# Patient Record
Sex: Female | Born: 1975
Health system: Southern US, Community
[De-identification: ages and names within clinical notes are randomized; demographics above are authoritative.]

## PROBLEM LIST (undated history)

## (undated) HISTORY — PX: OTHER SURGICAL HISTORY: SHX169

---

## 1997-09-19 ENCOUNTER — Other Ambulatory Visit: Admission: RE | Admit: 1997-09-19 | Discharge: 1997-09-19 | Payer: Self-pay | Admitting: Obstetrics

## 2007-11-06 ENCOUNTER — Other Ambulatory Visit: Admission: RE | Admit: 2007-11-06 | Discharge: 2007-11-06 | Payer: Self-pay | Admitting: Obstetrics and Gynecology

## 2008-12-18 ENCOUNTER — Other Ambulatory Visit: Admission: RE | Admit: 2008-12-18 | Discharge: 2008-12-18 | Payer: Self-pay | Admitting: Family Medicine

## 2010-03-22 ENCOUNTER — Other Ambulatory Visit
Admission: RE | Admit: 2010-03-22 | Discharge: 2010-03-22 | Payer: Self-pay | Source: Home / Self Care | Admitting: Family Medicine

## 2012-04-17 ENCOUNTER — Other Ambulatory Visit (HOSPITAL_COMMUNITY)
Admission: RE | Admit: 2012-04-17 | Discharge: 2012-04-17 | Disposition: A | Discharge status: Home / Self Care | Payer: BC Managed Care – PPO | Source: Ambulatory Visit | Attending: Family Medicine | Admitting: Family Medicine

## 2012-04-17 DIAGNOSIS — Z0000 Encounter for general adult medical examination without abnormal findings: Secondary | ICD-10-CM | POA: Insufficient documentation

## 2016-03-28 ENCOUNTER — Emergency Department (HOSPITAL_COMMUNITY): Payer: Self-pay

## 2016-03-28 ENCOUNTER — Emergency Department (HOSPITAL_COMMUNITY)
Admission: EM | Admit: 2016-03-28 | Discharge: 2016-03-29 | Disposition: A | Discharge status: Home / Self Care | Payer: Self-pay | Attending: Emergency Medicine | Admitting: Emergency Medicine

## 2016-03-28 ENCOUNTER — Encounter (HOSPITAL_COMMUNITY): Payer: Self-pay | Admitting: Emergency Medicine

## 2016-03-28 DIAGNOSIS — J40 Bronchitis, not specified as acute or chronic: Secondary | ICD-10-CM | POA: Insufficient documentation

## 2016-03-28 MED ORDER — PREDNISONE 20 MG PO TABS
60.0000 mg | ORAL_TABLET | Freq: Once | ORAL | Status: AC
  Administered 2016-03-28: 60 mg via ORAL
  Filled 2016-03-28: qty 3

## 2016-03-28 MED ORDER — HYDROCOD POLST-CPM POLST ER 10-8 MG/5ML PO SUER
5.0000 mL | Freq: Once | ORAL | Status: AC
  Administered 2016-03-28: 5 mL via ORAL
  Filled 2016-03-28: qty 5

## 2016-03-28 MED ORDER — IPRATROPIUM-ALBUTEROL 0.5-2.5 (3) MG/3ML IN SOLN
3.0000 mL | Freq: Once | RESPIRATORY_TRACT | Status: AC
  Administered 2016-03-28: 3 mL via RESPIRATORY_TRACT
  Filled 2016-03-28: qty 3

## 2016-03-28 MED ORDER — ALBUTEROL SULFATE HFA 108 (90 BASE) MCG/ACT IN AERS
2.0000 | INHALATION_SPRAY | Freq: Once | RESPIRATORY_TRACT | Status: AC
  Administered 2016-03-29: 2 via RESPIRATORY_TRACT
  Filled 2016-03-28: qty 6.7

## 2016-03-28 MED ORDER — PREDNISONE 20 MG PO TABS: 40.0000 mg | ORAL_TABLET | Freq: Every day | ORAL | 0 refills | Status: DC

## 2016-03-28 MED ORDER — HYDROCODONE-HOMATROPINE 5-1.5 MG/5ML PO SYRP: 5.0000 mL | ORAL_SOLUTION | Freq: Four times a day (QID) | ORAL | 0 refills | Status: DC | PRN
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
## 2016-03-28 NOTE — ED Triage Notes (Signed)
Pt. reports persistent productive cough with runny nose, nasal congestion , fatigue and chest congestion onset last week , denies fever or chills . Respirations unlabored .

## 2016-03-29 NOTE — Discharge Instructions (Signed)
Take inhaler 2 puffs every 4 hrs for cough and chest tightness. Take hycodan for cough as prescribed as needed. Do not drive if taking this medication. Take prednisone as prescribed until all gone. Follow up with your doctor if not improving. Return if worsening.

## 2017-03-14 ENCOUNTER — Ambulatory Visit (INDEPENDENT_AMBULATORY_CARE_PROVIDER_SITE_OTHER): Payer: 59 | Admitting: Cardiology

## 2017-03-14 ENCOUNTER — Encounter: Payer: Self-pay | Admitting: Cardiology

## 2017-03-14 DIAGNOSIS — R0789 Other chest pain: Secondary | ICD-10-CM | POA: Diagnosis not present

## 2017-03-14 DIAGNOSIS — I951 Orthostatic hypotension: Secondary | ICD-10-CM

## 2017-03-14 DIAGNOSIS — I959 Hypotension, unspecified: Secondary | ICD-10-CM | POA: Insufficient documentation

## 2017-03-14 DIAGNOSIS — R079 Chest pain, unspecified: Secondary | ICD-10-CM | POA: Insufficient documentation

## 2017-03-14 NOTE — Addendum Note (Signed)
Addended by: Orland Penman on: 03/14/2017 10:10 AM   Modules accepted: Orders

## 2017-03-14 NOTE — Addendum Note (Signed)
Addended by: Mattie Marlin on: 03/14/2017 10:04 AM   Modules accepted: Orders

## 2017-03-14 NOTE — Patient Instructions (Addendum)
Medication Instructions:  Your physician recommends that you continue on your current medications as directed. Please refer to the Current Medication list given to you today.   Labwork: None  Testing/Procedures: Your physician has requested that you have a stress echocardiogram. For further information please visit [email or web address]. Please follow instruction sheet as given.  You had an EKG today  Follow-Up: Your physician recommends that you schedule a follow-up appointment in: 3 months  Any Other Special Instructions Will Be Listed Below (If Applicable).     If you need a refill on your cardiac medications before your next appointment, please call your pharmacy.   [place], RN, BSN   Exercise Stress Echocardiogram An exercise stress echocardiogram is a test that checks how well your heart is working. For this test, you will walk on a treadmill to make your heart beat faster. This test uses sound waves (ultrasound) and a computer to make pictures (images) of your heart. These pictures will be taken before you exercise and after you exercise. What happens before the procedure?  Follow instructions from your doctor about what you cannot eat or drink before the test.  Do not drink or eat anything that has caffeine in it. Stop having caffeine for 24 hours before the test.  Ask your doctor about changing or stopping your normal medicines. This is important if you take diabetes medicines or blood thinners. Ask your doctor if you should take your medicines with water before the test.  If you use an inhaler, bring it to the test.  Do not use any products that have nicotine or tobacco in them, such as cigarettes and e-cigarettes. Stop using them for 4 hours before the test. If you need help quitting, ask your doctor.  Wear comfortable shoes and clothing. What happens during the procedure?  You will be hooked up to a TV screen. Your doctor will watch the screen  to see how fast your heart beats during the test.  Before you exercise, a computer will make a picture of your heart. To do this: ? A gel will be put on your chest. ? A wand will be moved over the gel. ? Sound waves from the wand will go to the computer to make the picture.  Your will start walking on a treadmill. The treadmill will start at a slow speed. It will get faster a little bit at a time. When you walk faster, your heart will beat faster.  The treadmill will be stopped when your heart is working hard.  You will lie down right away so another picture of your heart can be taken.  The test will take 30-60 minutes. What happens after the procedure?  Your heart rate and blood pressure will be watched after the test.  If your doctor says that you can, you may: ? Eat what you usually eat. ? Do your normal activities. ? Take medicines like normal. Summary  An exercise stress echocardiogram is a test that checks how well your heart is working.  Follow instructions about what you cannot eat or drink before the test. Ask your doctor if you should take your normal medicines before the test.  Stop having caffeine for 24 hours before the test. Do not use anything with nicotine or tobacco in it for 4 hours before the test.  A computer will take a picture of your heart before you walk on a treadmill. It will take another picture when you are done walking.  Your heart rate and blood pressure will be watched after the test. This information is not intended to replace advice given to you by your health care provider. Make sure you discuss any questions you have with your health care provider. Document Released: 02/27/2009 Document Revised: 01/24/2016 Document Reviewed: 01/24/2016 Elsevier Interactive Patient Education  2017 [employer].
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
## 2017-03-15 ENCOUNTER — Other Ambulatory Visit (INDEPENDENT_AMBULATORY_CARE_PROVIDER_SITE_OTHER): Payer: 59

## 2017-03-15 DIAGNOSIS — R079 Chest pain, unspecified: Secondary | ICD-10-CM

## 2017-04-03 ENCOUNTER — Ambulatory Visit (HOSPITAL_BASED_OUTPATIENT_CLINIC_OR_DEPARTMENT_OTHER)
Admission: RE | Admit: 2017-04-03 | Discharge: 2017-04-03 | Disposition: A | Discharge status: Home / Self Care | Payer: 59 | Source: Ambulatory Visit | Attending: Cardiology | Admitting: Cardiology

## 2017-04-03 DIAGNOSIS — I951 Orthostatic hypotension: Secondary | ICD-10-CM

## 2017-04-03 DIAGNOSIS — R0789 Other chest pain: Secondary | ICD-10-CM | POA: Diagnosis not present

## 2017-04-03 LAB — ECHOCARDIOGRAM STRESS TEST
FS: 31 % (ref 28–44)
IVS/LV PW RATIO, ED: 1.12
LA ID, A-P, ES: 25 mm
LADIAMINDEX: 1.55 cm/m2
LEFT ATRIUM END SYS DIAM: 25 mm
LV PW d: 7.73 mm — AB (ref 0.6–1.1)

## 2017-04-03 NOTE — Progress Notes (Signed)
Echocardiogram Echocardiogram Stress Test has been performed.  Cynthia Fitzpatrick 04/03/2017, 11:35 AM

## 2017-06-12 ENCOUNTER — Other Ambulatory Visit (HOSPITAL_COMMUNITY)
Admission: RE | Admit: 2017-06-12 | Discharge: 2017-06-12 | Disposition: A | Discharge status: Home / Self Care | Payer: 59 | Source: Ambulatory Visit | Attending: Family Medicine | Admitting: Family Medicine

## 2017-06-12 ENCOUNTER — Other Ambulatory Visit: Payer: Self-pay | Admitting: Family Medicine

## 2017-06-12 DIAGNOSIS — Z124 Encounter for screening for malignant neoplasm of cervix: Secondary | ICD-10-CM | POA: Insufficient documentation

## 2017-06-13 LAB — CYTOLOGY - PAP
Diagnosis: NEGATIVE
HPV (WINDOPATH): NOT DETECTED

## 2018-08-09 ENCOUNTER — Other Ambulatory Visit: Payer: Self-pay | Admitting: Family Medicine

## 2018-08-09 DIAGNOSIS — Z1231 Encounter for screening mammogram for malignant neoplasm of breast: Secondary | ICD-10-CM

## 2018-10-04 ENCOUNTER — Other Ambulatory Visit: Payer: Self-pay

## 2018-10-04 ENCOUNTER — Ambulatory Visit
Admission: RE | Admit: 2018-10-04 | Discharge: 2018-10-04 | Disposition: A | Discharge status: Home / Self Care | Payer: 59 | Source: Ambulatory Visit | Attending: Family Medicine | Admitting: Family Medicine

## 2018-10-04 DIAGNOSIS — Z1231 Encounter for screening mammogram for malignant neoplasm of breast: Secondary | ICD-10-CM

## 2018-10-16 DIAGNOSIS — M65832 Other synovitis and tenosynovitis, left forearm: Secondary | ICD-10-CM | POA: Diagnosis not present

## 2018-12-25 DIAGNOSIS — H401113 Primary open-angle glaucoma, right eye, severe stage: Secondary | ICD-10-CM | POA: Diagnosis not present

## 2018-12-25 DIAGNOSIS — R51 Headache: Secondary | ICD-10-CM | POA: Diagnosis not present

## 2018-12-25 DIAGNOSIS — H401122 Primary open-angle glaucoma, left eye, moderate stage: Secondary | ICD-10-CM | POA: Diagnosis not present

## 2019-08-01 ENCOUNTER — Other Ambulatory Visit: Payer: Self-pay | Admitting: Family Medicine

## 2019-08-01 DIAGNOSIS — E559 Vitamin D deficiency, unspecified: Secondary | ICD-10-CM | POA: Diagnosis not present

## 2019-08-01 DIAGNOSIS — R69 Illness, unspecified: Secondary | ICD-10-CM | POA: Diagnosis not present

## 2019-08-01 DIAGNOSIS — Z13220 Encounter for screening for lipoid disorders: Secondary | ICD-10-CM | POA: Diagnosis not present

## 2019-08-01 DIAGNOSIS — Z1231 Encounter for screening mammogram for malignant neoplasm of breast: Secondary | ICD-10-CM | POA: Diagnosis not present

## 2019-08-01 DIAGNOSIS — Z0000 Encounter for general adult medical examination without abnormal findings: Secondary | ICD-10-CM | POA: Diagnosis not present

## 2019-10-07 ENCOUNTER — Other Ambulatory Visit: Payer: Self-pay

## 2019-10-07 ENCOUNTER — Ambulatory Visit
Admission: RE | Admit: 2019-10-07 | Discharge: 2019-10-07 | Disposition: A | Discharge status: Home / Self Care | Payer: 59 | Source: Ambulatory Visit | Attending: Family Medicine | Admitting: Family Medicine

## 2019-10-07 DIAGNOSIS — Z1231 Encounter for screening mammogram for malignant neoplasm of breast: Secondary | ICD-10-CM | POA: Diagnosis not present

## 2020-01-08 DIAGNOSIS — H401122 Primary open-angle glaucoma, left eye, moderate stage: Secondary | ICD-10-CM | POA: Diagnosis not present

## 2020-01-08 DIAGNOSIS — R519 Headache, unspecified: Secondary | ICD-10-CM | POA: Diagnosis not present

## 2020-01-08 DIAGNOSIS — H401113 Primary open-angle glaucoma, right eye, severe stage: Secondary | ICD-10-CM | POA: Diagnosis not present

## 2020-03-31 DIAGNOSIS — R519 Headache, unspecified: Secondary | ICD-10-CM | POA: Diagnosis not present

## 2020-03-31 DIAGNOSIS — H401122 Primary open-angle glaucoma, left eye, moderate stage: Secondary | ICD-10-CM | POA: Diagnosis not present

## 2020-03-31 DIAGNOSIS — H401113 Primary open-angle glaucoma, right eye, severe stage: Secondary | ICD-10-CM | POA: Diagnosis not present

## 2020-04-15 DIAGNOSIS — T50B95A Adverse effect of other viral vaccines, initial encounter: Secondary | ICD-10-CM | POA: Diagnosis not present

## 2020-04-15 DIAGNOSIS — Z309 Encounter for contraceptive management, unspecified: Secondary | ICD-10-CM | POA: Diagnosis not present

## 2020-04-15 DIAGNOSIS — E65 Localized adiposity: Secondary | ICD-10-CM | POA: Diagnosis not present

## 2020-05-12 DIAGNOSIS — H401122 Primary open-angle glaucoma, left eye, moderate stage: Secondary | ICD-10-CM | POA: Diagnosis not present

## 2020-05-12 DIAGNOSIS — R519 Headache, unspecified: Secondary | ICD-10-CM | POA: Diagnosis not present

## 2020-05-12 DIAGNOSIS — H401113 Primary open-angle glaucoma, right eye, severe stage: Secondary | ICD-10-CM | POA: Diagnosis not present

## 2020-08-02 DIAGNOSIS — Z20822 Contact with and (suspected) exposure to covid-19: Secondary | ICD-10-CM | POA: Diagnosis not present

## 2020-08-03 DIAGNOSIS — D229 Melanocytic nevi, unspecified: Secondary | ICD-10-CM | POA: Diagnosis not present

## 2020-08-03 DIAGNOSIS — Z1231 Encounter for screening mammogram for malignant neoplasm of breast: Secondary | ICD-10-CM | POA: Diagnosis not present

## 2020-08-03 DIAGNOSIS — Z13220 Encounter for screening for lipoid disorders: Secondary | ICD-10-CM | POA: Diagnosis not present

## 2020-08-03 DIAGNOSIS — Z113 Encounter for screening for infections with a predominantly sexual mode of transmission: Secondary | ICD-10-CM | POA: Diagnosis not present

## 2020-08-03 DIAGNOSIS — D72819 Decreased white blood cell count, unspecified: Secondary | ICD-10-CM | POA: Diagnosis not present

## 2020-08-03 DIAGNOSIS — Z0000 Encounter for general adult medical examination without abnormal findings: Secondary | ICD-10-CM | POA: Diagnosis not present

## 2020-08-03 DIAGNOSIS — R69 Illness, unspecified: Secondary | ICD-10-CM | POA: Diagnosis not present

## 2020-08-04 DIAGNOSIS — R69 Illness, unspecified: Secondary | ICD-10-CM | POA: Diagnosis not present

## 2020-08-04 DIAGNOSIS — Z0000 Encounter for general adult medical examination without abnormal findings: Secondary | ICD-10-CM | POA: Diagnosis not present

## 2020-08-11 ENCOUNTER — Other Ambulatory Visit: Payer: Self-pay | Admitting: Family Medicine

## 2020-08-11 DIAGNOSIS — Z1231 Encounter for screening mammogram for malignant neoplasm of breast: Secondary | ICD-10-CM

## 2020-08-26 DIAGNOSIS — H401122 Primary open-angle glaucoma, left eye, moderate stage: Secondary | ICD-10-CM | POA: Diagnosis not present

## 2020-08-26 DIAGNOSIS — H401113 Primary open-angle glaucoma, right eye, severe stage: Secondary | ICD-10-CM | POA: Diagnosis not present

## 2020-11-09 ENCOUNTER — Ambulatory Visit
Admission: RE | Admit: 2020-11-09 | Discharge: 2020-11-09 | Disposition: A | Discharge status: Home / Self Care | Payer: 59 | Source: Ambulatory Visit | Attending: Family Medicine | Admitting: Family Medicine

## 2020-11-09 ENCOUNTER — Other Ambulatory Visit: Payer: Self-pay

## 2020-11-09 DIAGNOSIS — Z1231 Encounter for screening mammogram for malignant neoplasm of breast: Secondary | ICD-10-CM

## 2020-11-24 DIAGNOSIS — H401113 Primary open-angle glaucoma, right eye, severe stage: Secondary | ICD-10-CM | POA: Diagnosis not present

## 2020-11-24 DIAGNOSIS — H401122 Primary open-angle glaucoma, left eye, moderate stage: Secondary | ICD-10-CM | POA: Diagnosis not present

## 2021-01-09 ENCOUNTER — Emergency Department (HOSPITAL_COMMUNITY): Payer: 59

## 2021-01-09 ENCOUNTER — Encounter (HOSPITAL_COMMUNITY): Payer: Self-pay | Admitting: Emergency Medicine

## 2021-01-09 ENCOUNTER — Emergency Department (HOSPITAL_COMMUNITY)
Admission: EM | Admit: 2021-01-09 | Discharge: 2021-01-10 | Disposition: A | Discharge status: Home / Self Care | Payer: 59 | Attending: Emergency Medicine | Admitting: Emergency Medicine

## 2021-01-09 ENCOUNTER — Other Ambulatory Visit: Payer: Self-pay

## 2021-01-09 DIAGNOSIS — R079 Chest pain, unspecified: Secondary | ICD-10-CM

## 2021-01-09 DIAGNOSIS — R072 Precordial pain: Secondary | ICD-10-CM | POA: Insufficient documentation

## 2021-01-09 LAB — CBC WITH DIFFERENTIAL/PLATELET
Abs Immature Granulocytes: 0.01 10*3/uL (ref 0.00–0.07)
Basophils Absolute: 0.1 10*3/uL (ref 0.0–0.1)
Basophils Relative: 1 %
Eosinophils Absolute: 0.1 10*3/uL (ref 0.0–0.5)
Eosinophils Relative: 1 %
HCT: 42.6 % (ref 36.0–46.0)
Hemoglobin: 13.9 g/dL (ref 12.0–15.0)
Immature Granulocytes: 0 %
Lymphocytes Relative: 28 %
Lymphs Abs: 1.5 10*3/uL (ref 0.7–4.0)
MCH: 30.2 pg (ref 26.0–34.0)
MCHC: 32.6 g/dL (ref 30.0–36.0)
MCV: 92.6 fL (ref 80.0–100.0)
Monocytes Absolute: 0.6 10*3/uL (ref 0.1–1.0)
Monocytes Relative: 11 %
Neutro Abs: 3.3 10*3/uL (ref 1.7–7.7)
Neutrophils Relative %: 59 %
Platelets: 316 10*3/uL (ref 150–400)
RBC: 4.6 MIL/uL (ref 3.87–5.11)
RDW: 12.6 % (ref 11.5–15.5)
WBC: 5.5 10*3/uL (ref 4.0–10.5)
nRBC: 0 % (ref 0.0–0.2)

## 2021-01-09 LAB — BASIC METABOLIC PANEL
Anion gap: 7 (ref 5–15)
BUN: 9 mg/dL (ref 6–20)
CO2: 28 mmol/L (ref 22–32)
Calcium: 9.5 mg/dL (ref 8.9–10.3)
Chloride: 103 mmol/L (ref 98–111)
Creatinine, Ser: 0.68 mg/dL (ref 0.44–1.00)
GFR, Estimated: >60 mL/min (ref >60)
Glucose, Bld: 110 mg/dL — ABNORMAL HIGH (ref 70–99)
Potassium: 3.9 mmol/L (ref 3.5–5.1)
Sodium: 138 mmol/L (ref 135–145)

## 2021-01-09 LAB — I-STAT BETA HCG BLOOD, ED (MC, WL, AP ONLY): I-stat hCG, quantitative: <5 m[IU]/mL (ref <5)

## 2021-01-09 LAB — TROPONIN I (HIGH SENSITIVITY): Troponin I (High Sensitivity): 3 ng/L (ref <18)

## 2021-01-09 NOTE — ED Triage Notes (Signed)
Patient complaint of chest pain beginning this morning around 3 am. Denies shortness of breath, n/v.

## 2021-01-09 NOTE — ED Provider Notes (Signed)
Emergency Medicine Provider Triage Evaluation Note  Cynthia Fitzpatrick , a 45 y.o. female  was evaluated in triage.  Pt complains of CP. Began 3AM. Constant. Does not radiate. No diaphoresis, SOB, cough. Similar years ago. Saw cards with stress test. No abnormalities then per patient. No fever, UR sx. No hx of PE DVT, No LE edema  Review of Systems  Positive: Chest pain Negative: SOB, cough, fever, emesis, diaphoresis, numbness  Physical Exam  There were no vitals taken for this visit. Gen:   Awake, no distress   Cardiac: Clear Lungs:  clear Resp:  Normal effort  MSK:   Moves extremities without difficulty  Other:    Medical Decision Making  Medically screening exam initiated at 4:22 PM.  Appropriate orders placed.  Kala Pilat was informed that the remainder of the evaluation will be completed by another provider, this initial triage assessment does not replace that evaluation, and the importance of remaining in the ED until their evaluation is complete.  Chest pain   Work up started   Nettie Elm, PA-C 01/09/21 1627    Wyvonnia Dusky, MD 01/09/21 [phone]

## 2021-01-10 LAB — TROPONIN I (HIGH SENSITIVITY): Troponin I (High Sensitivity): 4 ng/L (ref <18)
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
## 2021-01-25 ENCOUNTER — Other Ambulatory Visit: Payer: Self-pay

## 2021-01-25 ENCOUNTER — Ambulatory Visit (INDEPENDENT_AMBULATORY_CARE_PROVIDER_SITE_OTHER): Payer: 59 | Admitting: Dermatology

## 2021-01-25 ENCOUNTER — Encounter: Payer: Self-pay | Admitting: Dermatology

## 2021-01-25 DIAGNOSIS — D485 Neoplasm of uncertain behavior of skin: Secondary | ICD-10-CM

## 2021-01-25 DIAGNOSIS — L821 Other seborrheic keratosis: Secondary | ICD-10-CM

## 2021-01-25 NOTE — Patient Instructions (Signed)

## 2021-02-05 ENCOUNTER — Encounter: Payer: Self-pay | Admitting: Dermatology

## 2021-02-05 NOTE — Progress Notes (Signed)
   New Patient   Subjective  Cynthia Fitzpatrick is a 45 y.o. female who presents for the following: Skin Problem (Right scalp mole per patient that has change size).  Growth on right scalp and spots on face Location:  Duration:  Quality:  Associated Signs/Symptoms: Modifying Factors:  Severity:  Timing: Context:    The following portions of the chart were reviewed this encounter and updated as appropriate:      Objective  Well appearing patient in no apparent distress; mood and affect are within normal limits. Right Temporal Scalp Dark brownVerrucous 8 mm papule; irritated SK versus epidermal nevus versus wart.       Head Multiple 1 to 4 mm in slightly raised dark brown slightly textured papules    A focused examination was performed including head and neck.. Relevant physical exam findings are noted in the Assessment and Plan.   Assessment & Plan  Neoplasm of uncertain behavior of skin Right Temporal Scalp  Skin / nail biopsy Type of biopsy: tangential   Informed consent: discussed and consent obtained   Timeout: patient name, date of birth, surgical site, and procedure verified   Procedure prep:  Patient was prepped and draped in usual sterile fashion (Non sterile) Prep type:  Chlorhexidine Anesthesia: the lesion was anesthetized in a standard fashion   Anesthetic:  1% lidocaine w/ epinephrine 1-100,000 local infiltration Instrument used: flexible razor blade   Hemostasis achieved with: ferric subsulfate and electrodesiccation   Outcome: patient tolerated procedure well   Post-procedure details: sterile dressing applied and wound care instructions given   Dressing type: petrolatum    Specimen 1 - Surgical pathology Differential Diagnosis: R/O Atypia - cautery after biopsy  Check Margins: No  Patient understandably requests removal.  She understands there is a possibility of scar or recurrence.  Dermatosis papulosa nigra Head  I explained that these  were not moles and were of no risk to become skin cancer and.  No intervention planned.

## 2021-02-24 DIAGNOSIS — H401122 Primary open-angle glaucoma, left eye, moderate stage: Secondary | ICD-10-CM | POA: Diagnosis not present

## 2021-02-24 DIAGNOSIS — H401113 Primary open-angle glaucoma, right eye, severe stage: Secondary | ICD-10-CM | POA: Diagnosis not present

## 2021-05-26 DIAGNOSIS — H401113 Primary open-angle glaucoma, right eye, severe stage: Secondary | ICD-10-CM | POA: Diagnosis not present

## 2021-05-26 DIAGNOSIS — H401122 Primary open-angle glaucoma, left eye, moderate stage: Secondary | ICD-10-CM | POA: Diagnosis not present

## 2021-08-12 DIAGNOSIS — Z13220 Encounter for screening for lipoid disorders: Secondary | ICD-10-CM | POA: Diagnosis not present

## 2021-08-12 DIAGNOSIS — N951 Menopausal and female climacteric states: Secondary | ICD-10-CM | POA: Diagnosis not present

## 2021-08-12 DIAGNOSIS — Z0000 Encounter for general adult medical examination without abnormal findings: Secondary | ICD-10-CM | POA: Diagnosis not present

## 2021-08-12 DIAGNOSIS — Z1231 Encounter for screening mammogram for malignant neoplasm of breast: Secondary | ICD-10-CM | POA: Diagnosis not present

## 2021-08-12 DIAGNOSIS — R69 Illness, unspecified: Secondary | ICD-10-CM | POA: Diagnosis not present

## 2021-08-12 DIAGNOSIS — Z1211 Encounter for screening for malignant neoplasm of colon: Secondary | ICD-10-CM | POA: Diagnosis not present

## 2021-08-17 ENCOUNTER — Other Ambulatory Visit: Payer: Self-pay | Admitting: Family Medicine

## 2021-08-17 DIAGNOSIS — Z1231 Encounter for screening mammogram for malignant neoplasm of breast: Secondary | ICD-10-CM

## 2021-08-25 DIAGNOSIS — H401113 Primary open-angle glaucoma, right eye, severe stage: Secondary | ICD-10-CM | POA: Diagnosis not present

## 2021-08-25 DIAGNOSIS — H401122 Primary open-angle glaucoma, left eye, moderate stage: Secondary | ICD-10-CM | POA: Diagnosis not present

## 2021-09-29 DIAGNOSIS — H401113 Primary open-angle glaucoma, right eye, severe stage: Secondary | ICD-10-CM | POA: Diagnosis not present

## 2021-09-29 DIAGNOSIS — H401122 Primary open-angle glaucoma, left eye, moderate stage: Secondary | ICD-10-CM | POA: Diagnosis not present

## 2021-11-11 ENCOUNTER — Ambulatory Visit
Admission: RE | Admit: 2021-11-11 | Discharge: 2021-11-11 | Disposition: A | Discharge status: Home / Self Care | Payer: No Typology Code available for payment source | Source: Ambulatory Visit | Attending: Family Medicine | Admitting: Family Medicine

## 2021-11-11 DIAGNOSIS — Z1231 Encounter for screening mammogram for malignant neoplasm of breast: Secondary | ICD-10-CM

## 2022-01-02 IMAGING — MG MM DIGITAL SCREENING BILAT W/ TOMO AND CAD
2 series · 3 of 6 positions shown · non-contrast
Comparison: Previous exam(s).

CLINICAL DATA: Screening.

EXAM:
DIGITAL SCREENING BILATERAL MAMMOGRAM WITH TOMOSYNTHESIS AND CAD
TECHNIQUE: Bilateral screening digital craniocaudal and mediolateral oblique
mammograms were obtained. Bilateral screening digital breast
tomosynthesis was performed. The images were evaluated with
computer-aided detection.

[R MLO synth-2D]
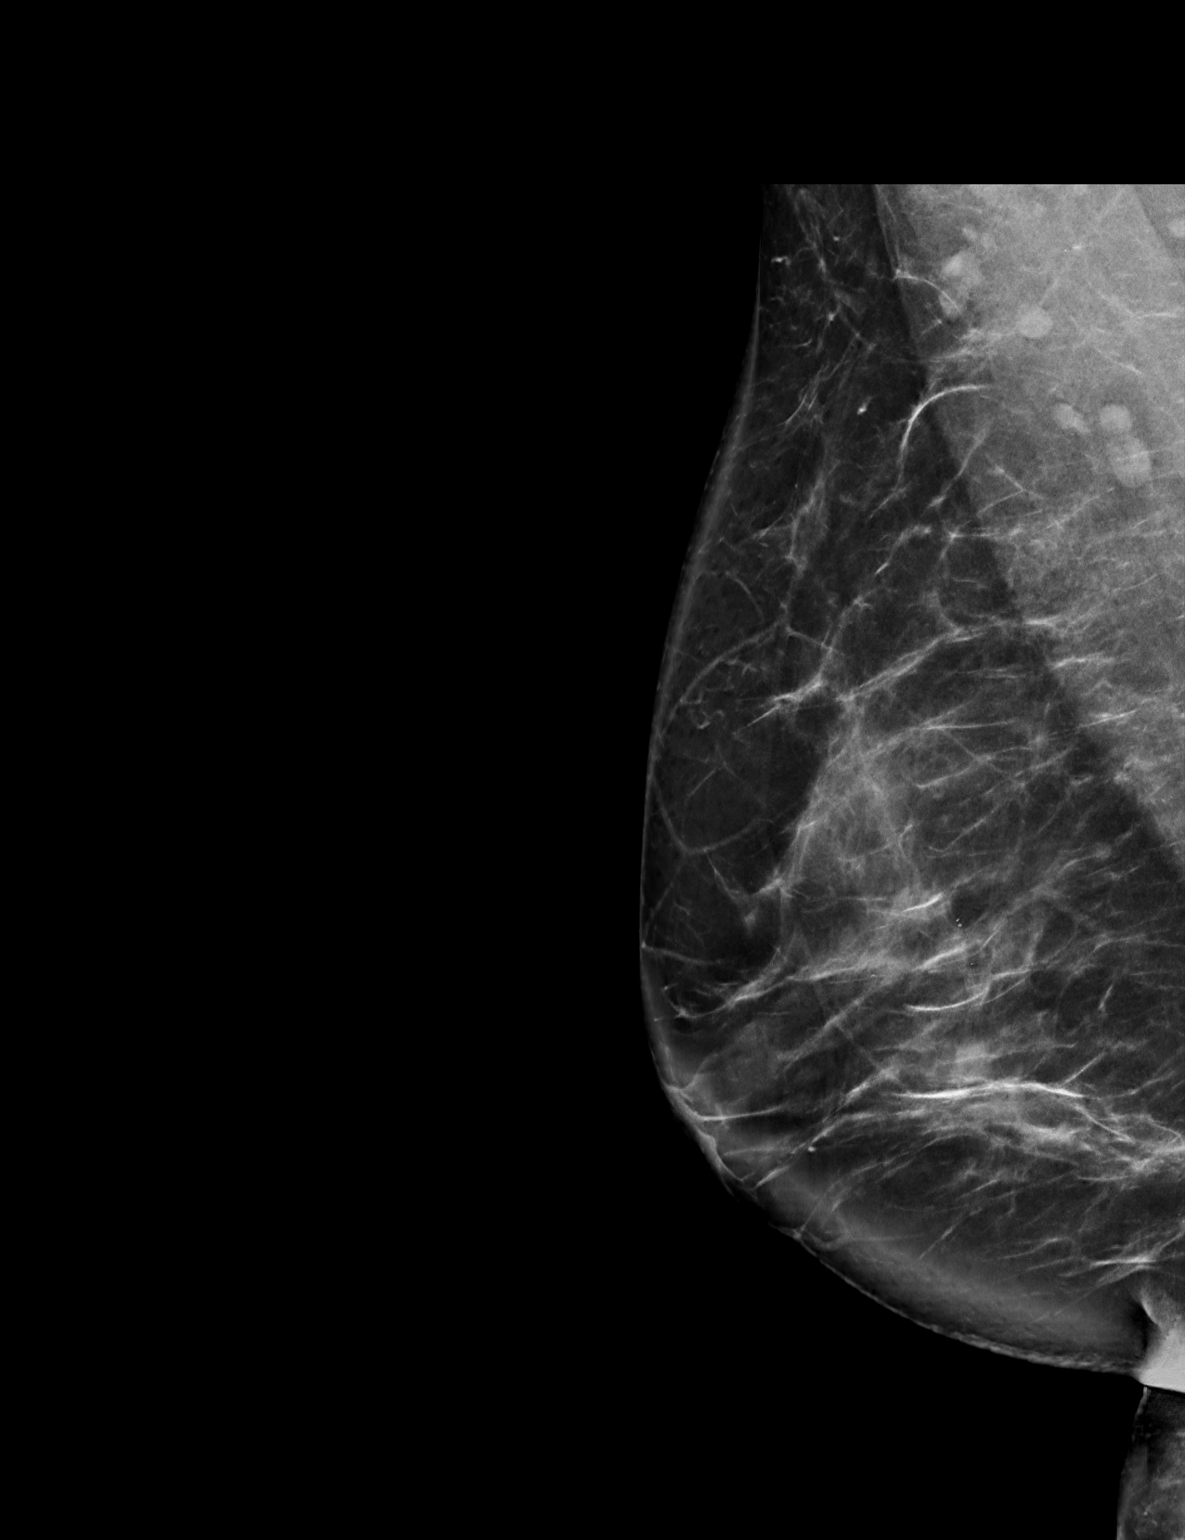

[R MLO tomo · 2 of 88 frames shown]
[frame 29/88]
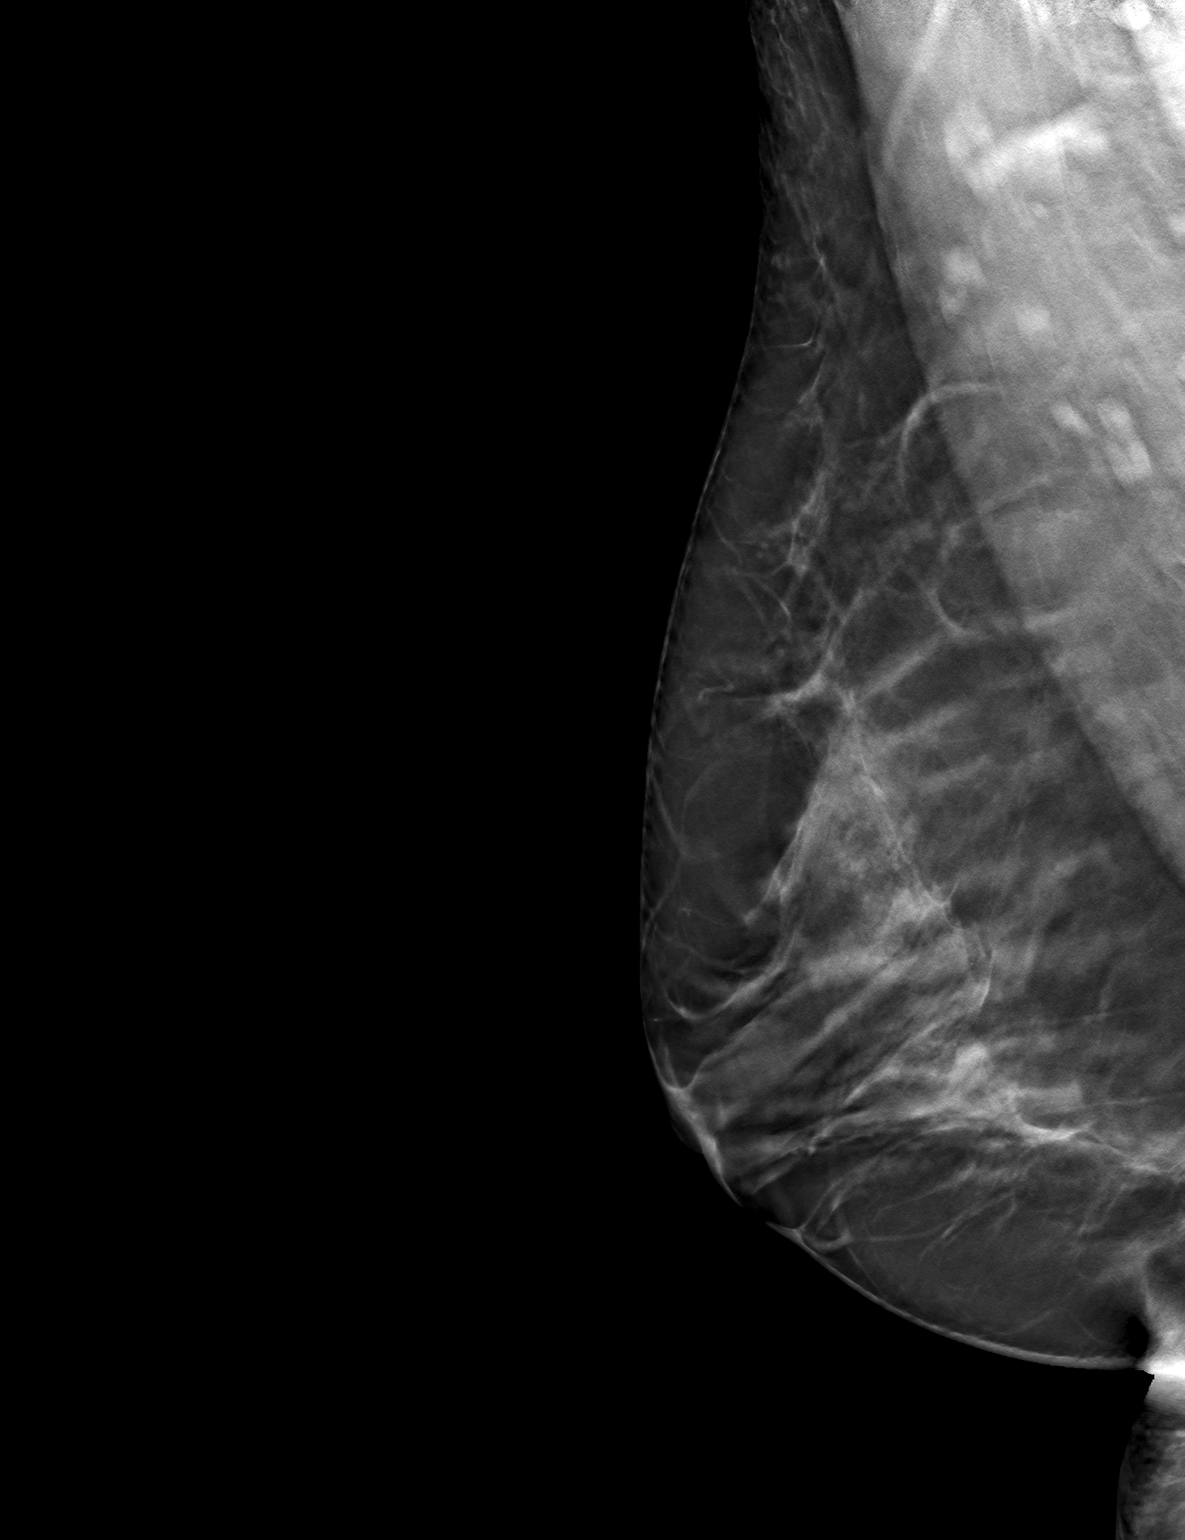
[frame 45/88]
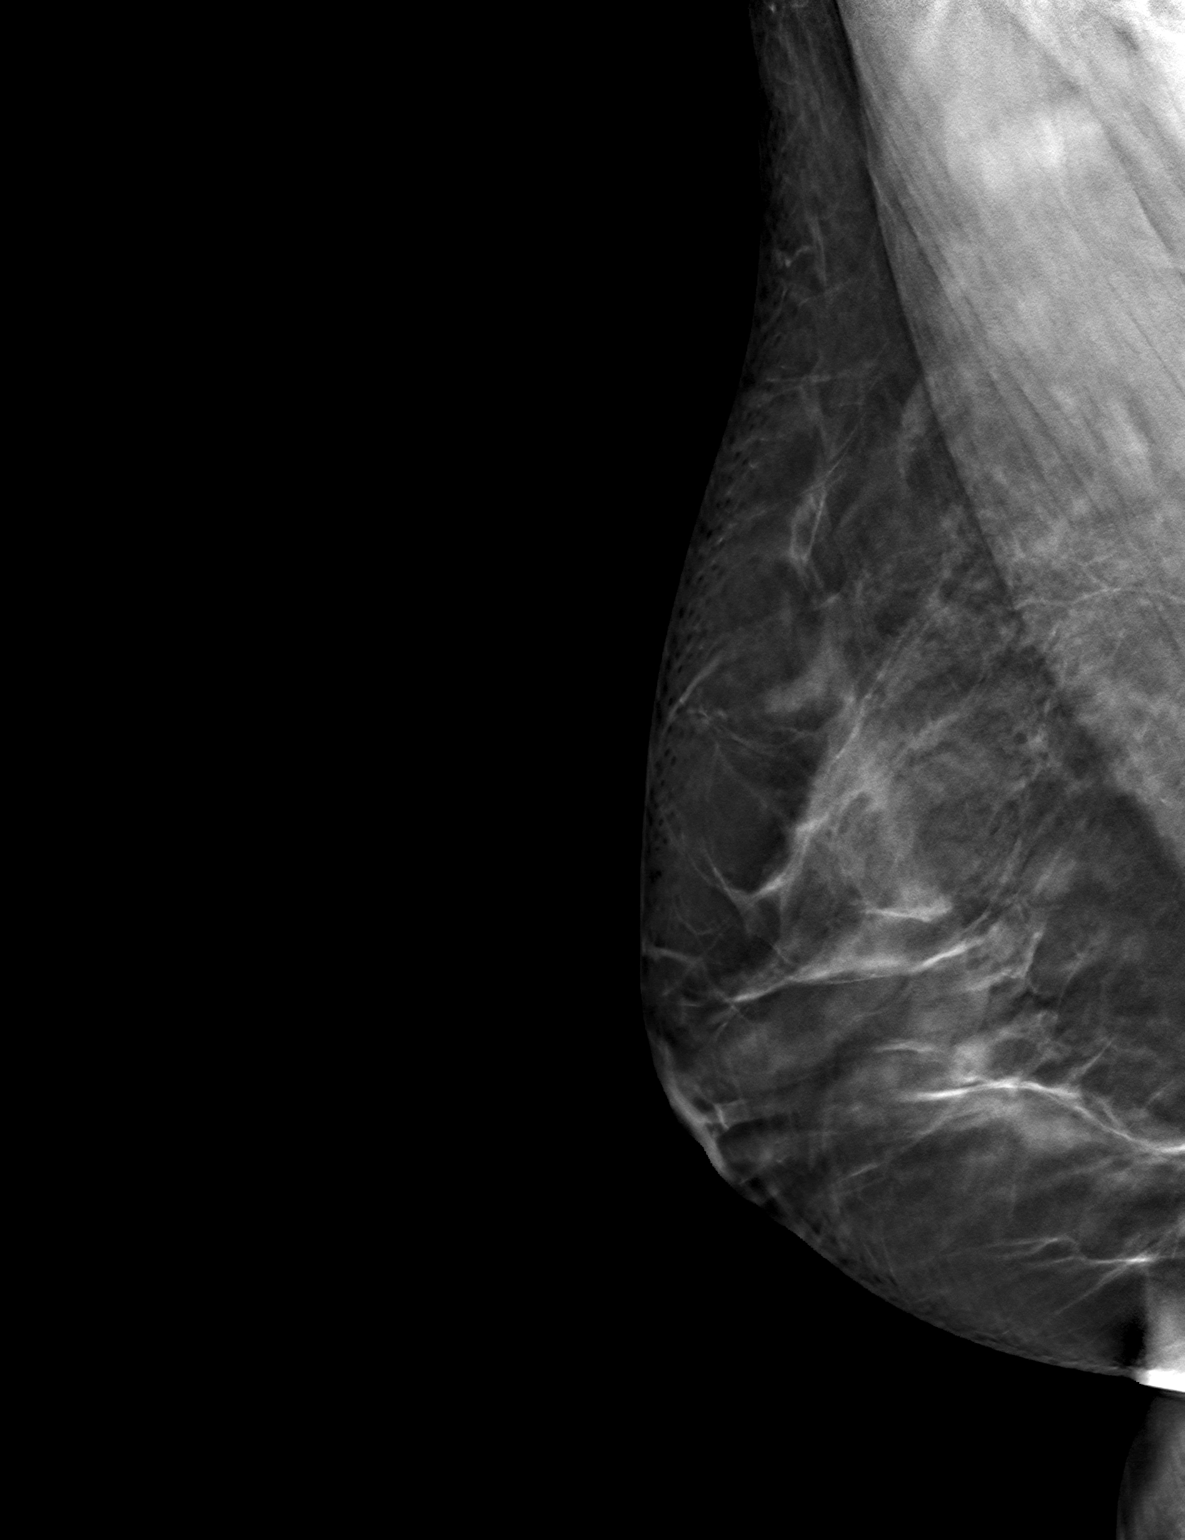

[3 of 6 positions shown; findings below may reference images not displayed]

ACR Breast Density Category c: The breast tissue is heterogeneously
dense, which may obscure small masses.
FINDINGS: There are no findings suspicious for malignancy.
IMPRESSION: No mammographic evidence of malignancy. A result letter of this
screening mammogram will be mailed directly to the patient.

RECOMMENDATION:
Screening mammogram in one year. (Code:Q3-W-BC3)

BI-RADS CATEGORY  1: Negative.

## 2022-03-04 IMAGING — CR DG CHEST 2V
2 series · 2 of 2 positions shown · non-contrast
Comparison: Chest x-ray dated 03/28/2016

CLINICAL DATA: Left-sided chest pain.

EXAM:
CHEST - 2 VIEW

[chest pa]
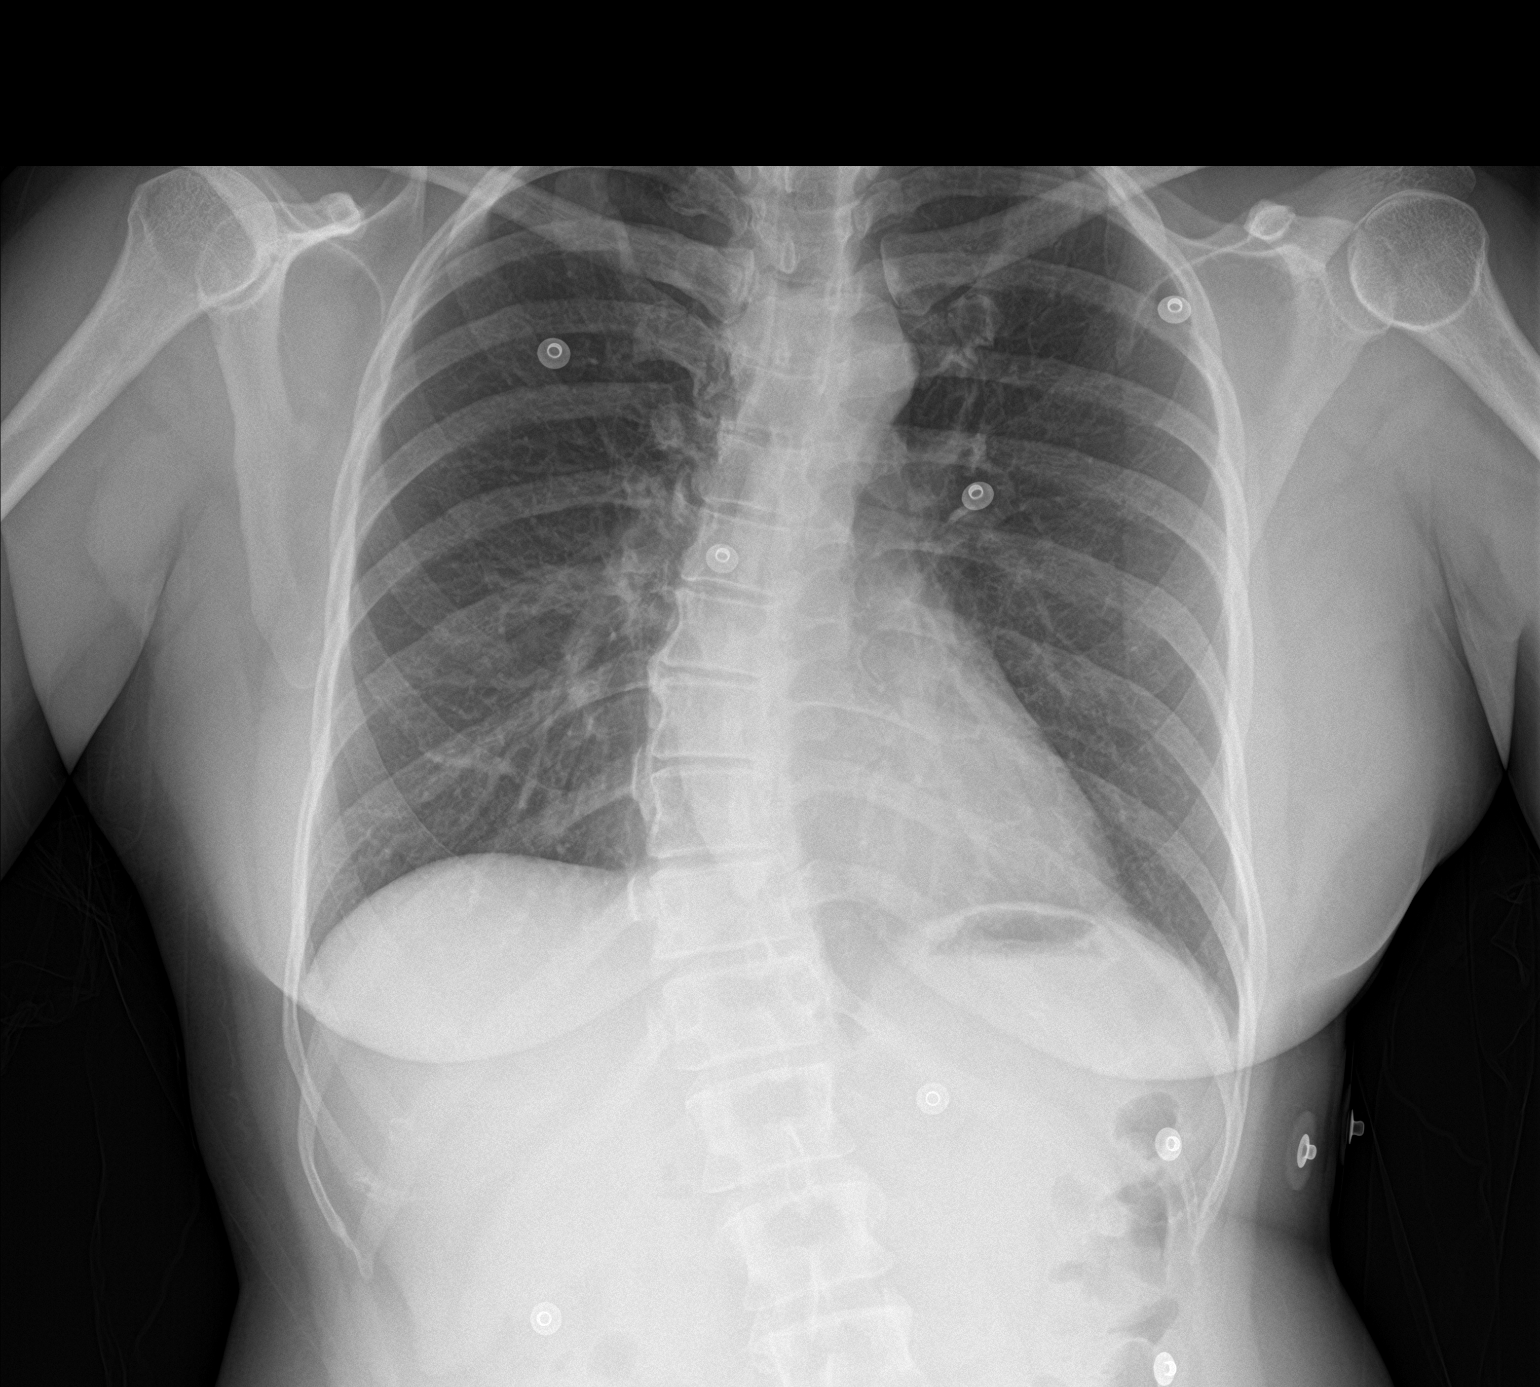

[chest lat]
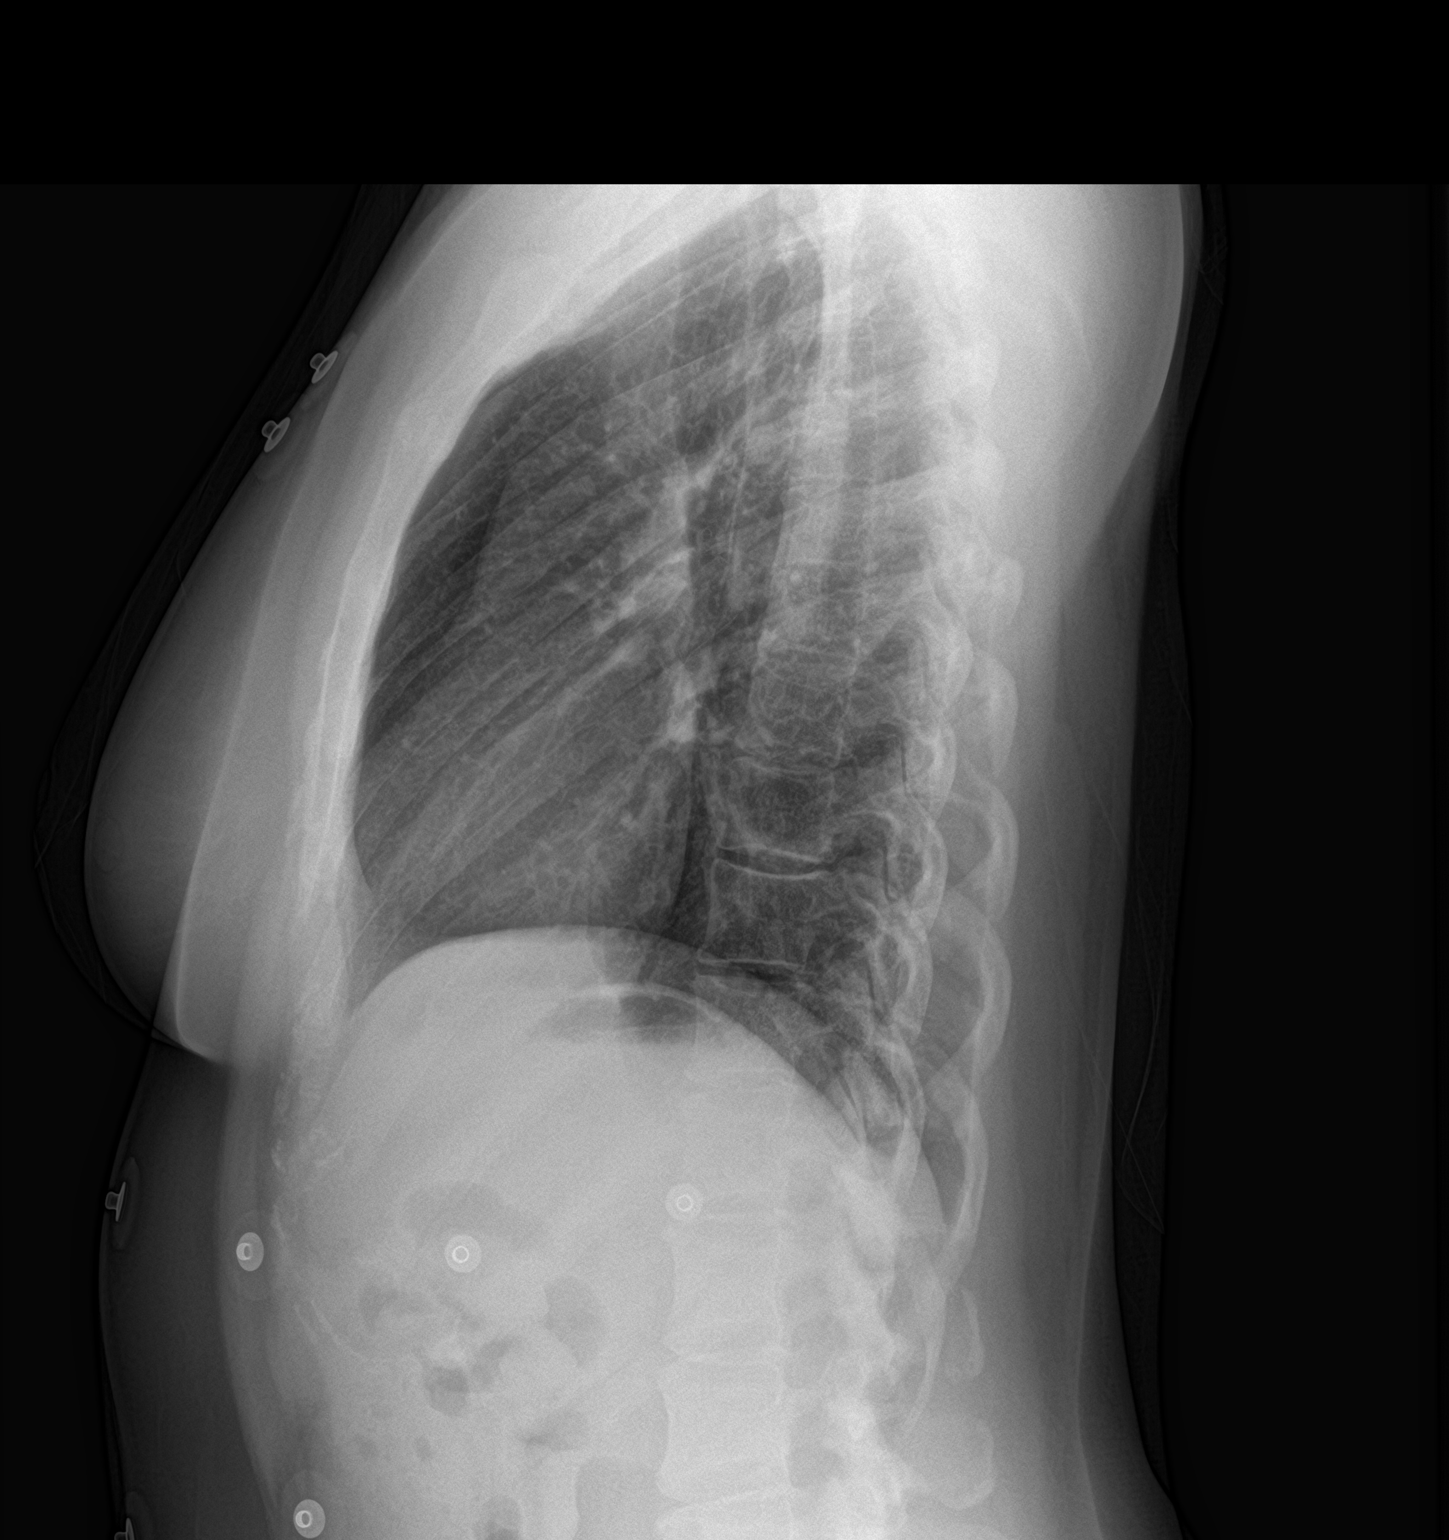

[2 of 2 positions shown; findings below may reference images not displayed]

FINDINGS: Heart size and mediastinal contours are stable. Lungs are clear. No
pleural effusion or pneumothorax is seen. No acute appearing osseous
abnormality. Stable dextroscoliosis of the thoracolumbar spine.
IMPRESSION: No active cardiopulmonary disease. No evidence of pneumonia or
pulmonary edema.

## 2022-05-31 ENCOUNTER — Other Ambulatory Visit: Payer: Self-pay | Admitting: Family Medicine

## 2022-05-31 DIAGNOSIS — Z1231 Encounter for screening mammogram for malignant neoplasm of breast: Secondary | ICD-10-CM

## 2022-11-14 ENCOUNTER — Ambulatory Visit: Payer: No Typology Code available for payment source

## 2022-11-16 ENCOUNTER — Ambulatory Visit
Admission: RE | Admit: 2022-11-16 | Discharge: 2022-11-16 | Disposition: A | Discharge status: Home / Self Care | Payer: No Typology Code available for payment source | Source: Ambulatory Visit | Attending: Family Medicine | Admitting: Family Medicine

## 2022-11-16 DIAGNOSIS — Z1231 Encounter for screening mammogram for malignant neoplasm of breast: Secondary | ICD-10-CM

## 2023-08-04 ENCOUNTER — Encounter (HOSPITAL_BASED_OUTPATIENT_CLINIC_OR_DEPARTMENT_OTHER): Payer: Self-pay | Admitting: Emergency Medicine

## 2023-08-04 ENCOUNTER — Emergency Department (HOSPITAL_BASED_OUTPATIENT_CLINIC_OR_DEPARTMENT_OTHER)
Admission: EM | Admit: 2023-08-04 | Discharge: 2023-08-04 | Disposition: A | Discharge status: Home / Self Care | Attending: Emergency Medicine | Admitting: Emergency Medicine

## 2023-08-04 ENCOUNTER — Other Ambulatory Visit: Payer: Self-pay

## 2023-08-04 DIAGNOSIS — Z853 Personal history of malignant neoplasm of breast: Secondary | ICD-10-CM | POA: Insufficient documentation

## 2023-08-04 DIAGNOSIS — R222 Localized swelling, mass and lump, trunk: Secondary | ICD-10-CM | POA: Insufficient documentation

## 2023-08-04 DIAGNOSIS — N63 Unspecified lump in unspecified breast: Secondary | ICD-10-CM

## 2023-08-04 NOTE — ED Triage Notes (Signed)
 Pt POV c/o pain and tingling in L breast, tenderness and warm x 2 days.  Denies nipple discharge. No known injury.

## 2023-08-04 NOTE — Discharge Instructions (Signed)
 You were seen in the ER for breast discomfort.  As we discussed I think that you would benefit from follow-up imaging whether it is a mammogram or breast ultrasound.  My greatest concern right now is either a mass or a cyst in the breast. You could have some discomfort in the arm pit from swelling of lymph nodes causing pressure on the bundle of nerves.  It does not appear your breast is infected, and I have very low concern that this pain is related to your heart or your lungs.   Please call your doctor on Monday to schedule follow up imaging. You can take ibuprofen or tylenol as needed for pain.

## 2023-08-04 NOTE — ED Provider Notes (Signed)
 [place] [facility] Provider Note   CSN: [id number] Arrival date & time: 08/04/23  2001     History  Chief Complaint  Patient presents with   Breast Pain    Cynthia Fitzpatrick is a 48 y.o. female with significant family hx of breast cancer who presents to the ER complaining of L sided breast discomfort for 2 days. She usually gets 3D mammograms yearly, is not due for another until this summer. Started noticing what she thought was swelling to the L lateral breast, and felt like her arm sat differently by her side due to that. No tenderness, no significant pain. No overlying skin changes. No fever or chills. Intermittently has some tingling in the fingers on her left hand. She is going through menopause and thought she could be having breast changes related to that. Called her PCP and was encouraged to come to the ED for imaging.   HPI     Home Medications Prior to Admission medications   Medication Sig Start Date End Date Taking? Authorizing Provider  ALPHAGAN P 0.1 % SOLN Place 1 drop into the right eye 3 (three) times daily. 01/02/21   [provider]  latanoprost (XALATAN) 0.005 % ophthalmic solution Place 1 drop into both eyes at bedtime. 12/30/20   [provider]      Allergies    Codeine    Review of Systems   Review of Systems  Constitutional:  Negative for chills and fever.  Respiratory:  Negative for shortness of breath.   Cardiovascular:  Negative for chest pain, palpitations and leg swelling.  Skin:  Negative for color change and rash.       Breast swelling  All other systems reviewed and are negative.   Physical Exam Updated Vital Signs BP (!) 123/90 (BP Location: Right Arm)   Pulse 84   Temp (!) 97.4 F (36.3 C)   Resp 20   Ht 5' (1.524 m)   Wt 70.8 kg   SpO2 100%   BMI 30.47 kg/m  Physical Exam Vitals and nursing note reviewed.  Constitutional:      Appearance: Normal appearance.  HENT:      Head: Normocephalic and atraumatic.  Eyes:     Conjunctiva/sclera: Conjunctivae normal.  Pulmonary:     Effort: Pulmonary effort is normal. No respiratory distress.  Chest:     Chest wall: No mass, deformity, swelling, tenderness or edema.  Breasts:    Breasts are symmetrical.     Right: Normal.     Left: Normal.  Lymphadenopathy:     Upper Body:     Left upper body: No supraclavicular, axillary or pectoral adenopathy.  Skin:    General: Skin is warm and dry.  Neurological:     Mental Status: She is alert.  Psychiatric:        Mood and Affect: Mood normal.        Behavior: Behavior normal.     ED Results / Procedures / Treatments   Labs (all labs ordered are listed, but only abnormal results are displayed) Labs Reviewed - No data to display  EKG None  Radiology No results found.  Procedures Procedures    Medications Ordered in ED Medications - No data to display  ED Course/ Medical Decision Making/ A&P  Medical Decision Making  This patient is a 48 y.o. female who presents to the ED for concern of L breast swelling.   Differential diagnoses prior to evaluation: Mastitis, breast cyst, abscess, lymphadenopathy, axillary folliculitis, axillary abscess  Past Medical History / Social History / Additional history: Chart reviewed. Pertinent results include: glaucoma, family hx of breast cancer  Physical Exam: Physical exam performed. The pertinent findings include: normal vitals, no distress. No appreciable breast deformity or axillary lymphadenopathy  Disposition: After consideration of the diagnostic results and the patients response to treatment, I feel that emergency department workup does not suggest an emergent condition requiring admission or immediate intervention beyond what has been performed at this time. The plan is: discharge to home. Strongly recommended following up with PCP for further breast imaging such as  ultrasound or mammogram. With significant family hx of breast cancer and potentially some symptoms consistent with pressure in the axillary nerves, I think patient is requiring more detailed imaging than can be performed in the ED and can be done on an outpatient basis. No emergent etiology identified for patient's symptoms. The patient is safe for discharge and has been instructed to return immediately for worsening symptoms, change in symptoms or any other concerns.  Final Clinical Impression(s) / ED Diagnoses Final diagnoses:  Breast swelling    Rx / DC Orders ED Discharge Orders     None      Portions of this report may have been transcribed using voice recognition software. Every effort was made to ensure accuracy; however, inadvertent computerized transcription errors may be present.    Jeanella Flattery 08/05/23 1108    Glyn Ade, MD 08/05/23 1510

## 2023-08-15 ENCOUNTER — Other Ambulatory Visit: Payer: Self-pay | Admitting: Family Medicine

## 2023-08-15 DIAGNOSIS — M79622 Pain in left upper arm: Secondary | ICD-10-CM

## 2023-08-21 ENCOUNTER — Ambulatory Visit
Admission: RE | Admit: 2023-08-21 | Discharge: 2023-08-21 | Disposition: A | Discharge status: Home / Self Care | Source: Ambulatory Visit | Attending: Family Medicine | Admitting: Family Medicine

## 2023-08-21 DIAGNOSIS — M79622 Pain in left upper arm: Secondary | ICD-10-CM

## 2023-12-25 ENCOUNTER — Other Ambulatory Visit: Payer: Self-pay | Admitting: Family Medicine

## 2023-12-25 DIAGNOSIS — Z1231 Encounter for screening mammogram for malignant neoplasm of breast: Secondary | ICD-10-CM

## 2024-06-13 ENCOUNTER — Encounter: Payer: Self-pay | Admitting: Dermatology

## 2024-06-13 ENCOUNTER — Ambulatory Visit: Admitting: Dermatology

## 2024-06-13 VITALS — BP 103/69 | HR 85

## 2024-06-13 DIAGNOSIS — Z1283 Encounter for screening for malignant neoplasm of skin: Secondary | ICD-10-CM | POA: Diagnosis not present

## 2024-06-13 DIAGNOSIS — L814 Other melanin hyperpigmentation: Secondary | ICD-10-CM | POA: Diagnosis not present

## 2024-06-13 DIAGNOSIS — D229 Melanocytic nevi, unspecified: Secondary | ICD-10-CM

## 2024-06-13 DIAGNOSIS — L821 Other seborrheic keratosis: Secondary | ICD-10-CM

## 2024-06-13 DIAGNOSIS — D1801 Hemangioma of skin and subcutaneous tissue: Secondary | ICD-10-CM

## 2024-06-13 NOTE — Patient Instructions (Addendum)
 Other Procedure(Skin Tags): Quote for cosmetic removal: Count spots prior to visit DEPOSIT REQUIRED WHEN APPOINTMENT SCHEDULED  $200 to remove on 1-15 $300 to remove on 16-30 $400 to removal on 31-More       Important Information   Due to recent changes in healthcare laws, you may see results of your pathology and/or laboratory studies on MyChart before the doctors have had a chance to review them. We understand that in some cases there may be results that are confusing or concerning to you. Please understand that not all results are received at the same time and often the doctors may need to interpret multiple results in order to provide you with the best plan of care or course of treatment. Therefore, we ask that you please give us  2 business days to thoroughly review all your results before contacting the office for clarification. Should we see a critical lab result, you will be contacted sooner.     If You Need Anything After Your Visit   If you have any questions or concerns for your doctor, please call our main line at [phone]. If no one answers, please leave a voicemail as directed and we will return your call as soon as possible. Messages left after 4 pm will be answered the following business day.    You may also send us  a message via MyChart. We typically respond to MyChart messages within 1-2 business days.  For prescription refills, please ask your pharmacy to contact our office. Our fax number is [phone].  If you have an urgent issue when the clinic is closed that cannot wait until the next business day, you can page your doctor at the number below.     Please note that while we do our best to be available for urgent issues outside of office hours, we are not available 24/7.    If you have an urgent issue and are unable to reach us , you may choose to seek medical care at your doctor's office, retail clinic, urgent care center, or emergency room.   If you have a  medical emergency, please immediately call 911 or go to the emergency department. In the event of inclement weather, please call our main line at [phone] for an update on the status of any delays or closures.  Dermatology Medication Tips: Please keep the boxes that topical medications come in in order to help keep track of the instructions about where and how to use these. Pharmacies typically print the medication instructions only on the boxes and not directly on the medication tubes.   If your medication is too expensive, please contact our office at [phone] or send us  a message through MyChart.    We are unable to tell what your co-pay for medications will be in advance as this is different depending on your insurance coverage. However, we may be able to find a substitute medication at lower cost or fill out paperwork to get insurance to cover a needed medication.    If a prior authorization is required to get your medication covered by your insurance company, please allow us  1-2 business days to complete this process.   Drug prices often vary depending on where the prescription is filled and some pharmacies may offer cheaper prices.   The website www.goodrx.com contains coupons for medications through different pharmacies. The prices here do not account for what the cost may be with help from insurance (it may be cheaper with your insurance), but the website can  give you the price if you did not use any insurance.  - You can print the associated coupon and take it with your prescription to the pharmacy.  - You may also stop by our office during regular business hours and pick up a GoodRx coupon card.  - If you need your prescription sent electronically to a different pharmacy, notify our office through [facility] or by phone at [phone]

## 2024-06-13 NOTE — Progress Notes (Signed)
" ° °  New Patient Visit   Subjective  Cynthia Fitzpatrick is a 49 y.o. female who presents for the following: Total Body Skin Exam (TBSE)  Patient present today for new patient visit for TBSE.The patient reports she has spots, moles and lesions to be evaluated, some may be new or changing and the patient may have concern these could be cancer. Patient has not previously been treated by a dermatologist. Patient reports she has hx of bx (Benign). Patient denies family history of skin cancers. Patient reports throughout her lifetime has had severe sun exposure. Currently, patient reports if she has excessive sun exposure, she does apply sunscreen and/or wears protective coverings.  The following portions of the chart were reviewed this encounter and updated as appropriate: medications, allergies, medical history  Review of Systems:  No other skin or systemic complaints except as noted in HPI or Assessment and Plan.  Objective  Well appearing patient in no apparent distress; mood and affect are within normal limits.  A full examination was performed including scalp, head, eyes, ears, nose, lips, neck, chest, axillae, abdomen, back, buttocks, bilateral upper extremities, bilateral lower extremities, hands, feet, fingers, toes, fingernails, and toenails. All findings within normal limits unless otherwise noted below.   Relevant exam findings are noted in the Assessment and Plan.   Assessment & Plan   LENTIGINES, SEBORRHEIC KERATOSES, HEMANGIOMAS - Benign normal skin lesions - Benign-appearing - Call for any changes  MELANOCYTIC NEVI - Tan-brown and/or pink-flesh-colored symmetric macules and papules - Benign appearing on exam today - Observation - Call clinic for new or changing moles - Recommend daily use of broad spectrum spf 30+ sunscreen to sun-exposed areas.   Skin Education - Recommend daily broad spectrum sunscreen SPF 30+ to sun-exposed areas, reapply every 2 hours as needed.  - Staying  in the shade or wearing long sleeves, sun glasses (UVA+UVB protection) and wide brim hats (4-inch brim around the entire circumference of the hat) are also recommended for sun protection.  - Call for new or changing lesions.  Dermatosis Papulosa Nigra  Skin Care: Dermatosis Papulosa Ferol can resolve with light electrodesiccation. Expectations: Dermatosis Papulosa Ferol are benign growths. No treatment is necessary. Plan: Cosmetic Quote The patient received the following cosmetic quote  Other Procedure(Skin Tags): Quote for cosmetic removal: $200 to remove on 1-15 $300 to remove on 16-30 $400 to removal on 31-More   SKIN CANCER SCREENING PERFORMED TODAY   Return in about 1 year (around 06/13/2025) for TBSE.  I, Kanoa Phillippi, am acting as neurosurgeon for [employer], DO.  Documentation: I have reviewed the above documentation for accuracy and completeness, and I agree with the above.  Delon Lenis, DO   "

## 2024-08-23 ENCOUNTER — Ambulatory Visit
